# Patient Record
Sex: Female | Born: 1975 | Race: White | Hispanic: No | Marital: Married | Smoking: Never smoker
Health system: Southern US, Community
[De-identification: ages and names within clinical notes are randomized; demographics above are authoritative.]

## PROBLEM LIST (undated history)

## (undated) DIAGNOSIS — F419 Anxiety disorder, unspecified: Secondary | ICD-10-CM

## (undated) DIAGNOSIS — F988 Other specified behavioral and emotional disorders with onset usually occurring in childhood and adolescence: Secondary | ICD-10-CM

## (undated) DIAGNOSIS — I10 Essential (primary) hypertension: Secondary | ICD-10-CM

## (undated) HISTORY — PX: ABDOMINAL SURGERY: SHX537

## (undated) HISTORY — PX: TONSILLECTOMY: SUR1361

---

## 2013-04-14 ENCOUNTER — Ambulatory Visit: Payer: Self-pay | Admitting: Physician Assistant

## 2013-04-16 ENCOUNTER — Ambulatory Visit: Payer: Self-pay | Admitting: Physician Assistant

## 2013-04-19 ENCOUNTER — Encounter: Payer: Self-pay | Admitting: Physician Assistant

## 2013-04-19 ENCOUNTER — Ambulatory Visit (INDEPENDENT_AMBULATORY_CARE_PROVIDER_SITE_OTHER): Payer: BC Managed Care – PPO | Admitting: Physician Assistant

## 2013-04-19 VITALS — BP 163/101 | HR 116 | Ht 60.0 in | Wt 140.0 lb

## 2013-04-19 DIAGNOSIS — F909 Attention-deficit hyperactivity disorder, unspecified type: Secondary | ICD-10-CM | POA: Insufficient documentation

## 2013-04-19 DIAGNOSIS — M549 Dorsalgia, unspecified: Secondary | ICD-10-CM

## 2013-04-19 DIAGNOSIS — I10 Essential (primary) hypertension: Secondary | ICD-10-CM

## 2013-04-19 DIAGNOSIS — R790 Abnormal level of blood mineral: Secondary | ICD-10-CM

## 2013-04-19 DIAGNOSIS — R5381 Other malaise: Secondary | ICD-10-CM

## 2013-04-19 DIAGNOSIS — R7989 Other specified abnormal findings of blood chemistry: Secondary | ICD-10-CM

## 2013-04-19 MED ORDER — HYDROCODONE-ACETAMINOPHEN 5-325 MG PO TABS: 1.0000 | ORAL_TABLET | Freq: Every day | ORAL | Status: DC

## 2013-04-19 MED ORDER — AMPHETAMINE-DEXTROAMPHETAMINE 30 MG PO TABS: 30.0000 mg | ORAL_TABLET | Freq: Two times a day (BID) | ORAL | Status: DC

## 2013-04-19 MED ORDER — LISINOPRIL-HYDROCHLOROTHIAZIDE 20-12.5 MG PO TABS: 1.0000 | ORAL_TABLET | Freq: Every day | ORAL | Status: DC

## 2013-04-19 MED ORDER — TRAMADOL HCL 50 MG PO TABS: 50.0000 mg | ORAL_TABLET | ORAL | Status: DC | PRN

## 2013-04-19 NOTE — Patient Instructions (Signed)
Magnesium 500mg  ibuprofen help inflammation.

## 2013-04-20 LAB — FERRITIN: Ferritin: 2 ng/mL — ABNORMAL LOW (ref 10–291)

## 2013-04-20 LAB — CBC WITH DIFFERENTIAL/PLATELET
Eosinophils Absolute: 0.1 10*3/uL (ref 0.0–0.7)
Lymphocytes Relative: 33 % (ref 12–46)
Lymphs Abs: 3.3 10*3/uL (ref 0.7–4.0)
MCH: 27.1 pg (ref 26.0–34.0)
Monocytes Absolute: 0.4 10*3/uL (ref 0.1–1.0)
Neutrophils Relative %: 62 % (ref 43–77)
Platelets: 513 10*3/uL — ABNORMAL HIGH (ref 150–400)
RBC: 4.68 MIL/uL (ref 3.87–5.11)
WBC: 10.2 10*3/uL (ref 4.0–10.5)

## 2013-04-20 LAB — COMPLETE METABOLIC PANEL WITH GFR
AST: 16 U/L (ref 0–37)
Alkaline Phosphatase: 70 U/L (ref 39–117)
BUN: 9 mg/dL (ref 6–23)
Creat: 0.63 mg/dL (ref 0.50–1.10)
Potassium: 4.1 mEq/L (ref 3.5–5.3)
Total Bilirubin: 0.2 mg/dL — ABNORMAL LOW (ref 0.3–1.2)

## 2013-04-20 LAB — MAGNESIUM: Magnesium: 2.1 mg/dL (ref 1.5–2.5)

## 2013-04-20 LAB — VITAMIN B12: Vitamin B-12: 343 pg/mL (ref 211–911)

## 2013-04-20 LAB — VITAMIN D 25 HYDROXY (VIT D DEFICIENCY, FRACTURES): Vit D, 25-Hydroxy: 32 ng/mL (ref 30–89)

## 2013-04-21 DIAGNOSIS — R790 Abnormal level of blood mineral: Secondary | ICD-10-CM | POA: Insufficient documentation

## 2013-04-21 DIAGNOSIS — I10 Essential (primary) hypertension: Secondary | ICD-10-CM | POA: Insufficient documentation
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
## 2013-04-30 ENCOUNTER — Ambulatory Visit: Payer: BC Managed Care – PPO | Admitting: Physician Assistant

## 2013-05-17 ENCOUNTER — Ambulatory Visit: Payer: BC Managed Care – PPO | Admitting: Family Medicine

## 2013-05-17 ENCOUNTER — Other Ambulatory Visit: Payer: Self-pay | Admitting: *Deleted

## 2013-05-17 MED ORDER — HYDROCODONE-ACETAMINOPHEN 5-325 MG PO TABS: 1.0000 | ORAL_TABLET | Freq: Every day | ORAL | Status: DC

## 2013-05-18 ENCOUNTER — Ambulatory Visit (INDEPENDENT_AMBULATORY_CARE_PROVIDER_SITE_OTHER): Payer: BC Managed Care – PPO | Admitting: Physician Assistant

## 2013-05-18 ENCOUNTER — Encounter: Payer: Self-pay | Admitting: Physician Assistant

## 2013-05-18 VITALS — BP 140/96 | HR 113 | Wt 140.0 lb

## 2013-05-18 DIAGNOSIS — I10 Essential (primary) hypertension: Secondary | ICD-10-CM

## 2013-05-18 DIAGNOSIS — F909 Attention-deficit hyperactivity disorder, unspecified type: Secondary | ICD-10-CM

## 2013-05-18 NOTE — Progress Notes (Signed)
   Subjective:    Patient ID: Alicia Newton, female    DOB: 08-28-1975, 37 y.o.   MRN: [id number]  HPI Patient is a 38 year old female who presents to the clinic to followup on ADHD and hypertension.  ADHD-patient reports to be doing well on Adderall. She said she has been on it for many years. She denies any anorexia, palpitations. She is under a lot of stress at work. 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 She has also had one episode of vaginal bleeding where she went to the ER. CT and ultrasound were done. Diagnosis was gastroenteritis in combination with menstrual cycle. She has not had symptoms.  Hypertension-patient has not been taking his lisinopril/HCTZ prescribed at last visit. She did restart taking the labetalol but reports it makes her feel like "crap". She admits to not even taking the labetalol everyday as prescribed. She denies any chest pain, jaw pain, arm numbness or tingling.   Review of Systems     Objective:   Physical Exam  Constitutional: She is oriented to person, place, and time. She appears well-developed and well-nourished.  HENT:  Head: Normocephalic and atraumatic.  Cardiovascular: Regular rhythm and normal heart sounds.   Tachycardia at 113  Pulmonary/Chest: Effort normal and breath sounds normal.  Neurological: She is alert and oriented to person, place, and time.  Skin: Skin is warm and dry.  Psychiatric: She has a normal mood and affect. Her behavior is normal.          Assessment & Plan:  ADHD- discuss with patient that I could not give her Adderall today do to her blood pressure and pulse rate. She became very upset and stated that she had to have adderall. She stated that it was the only thing to keep her together. She had not been without it in 10 plus years. She requested to speak to manager. She spoke with Haskell Riling and confirmed that we could not give at office until BP and pulse rate was normalized. Pt  instructed to take BP medication and follow up on Monday or Tuesday of next week. If BP/HR controlled would refill adderall.   HTN- uncontrolled. Since labetalol makes her feel fatigued. At this time. Start lisinopril/HCTZ daily. Will check in one week and see if blood pressure and heart rate are more controlled. Concern is that she will need a beta blocker to better control heart rate.  Spent 30 minutes with patient greater than 50% of visit spent counseling patient regarding the importance of blood pressure control in combination with stimulants.

## 2013-05-21 ENCOUNTER — Ambulatory Visit (INDEPENDENT_AMBULATORY_CARE_PROVIDER_SITE_OTHER): Payer: BC Managed Care – PPO | Admitting: Physician Assistant

## 2013-05-21 ENCOUNTER — Encounter: Payer: Self-pay | Admitting: *Deleted

## 2013-05-21 VITALS — BP 110/78 | HR 102 | Ht 60.0 in | Wt 137.0 lb

## 2013-05-21 DIAGNOSIS — F909 Attention-deficit hyperactivity disorder, unspecified type: Secondary | ICD-10-CM

## 2013-05-21 DIAGNOSIS — I10 Essential (primary) hypertension: Secondary | ICD-10-CM

## 2013-05-21 MED ORDER — AMPHETAMINE-DEXTROAMPHETAMINE 30 MG PO TABS: 30.0000 mg | ORAL_TABLET | Freq: Two times a day (BID) | ORAL | Status: DC

## 2013-05-21 NOTE — Progress Notes (Signed)
Pt here for bp recheck today, to get refill of adderall. She states that she has been taking her bp meds at night.Laureen Ochs, Viann Shove   Patient's blood pressure is controlled today. Will refill Adderall. Followup in one month. Continue on lisinopril/HCTZ. Tandy Gaw PA-C

## 2013-05-26 ENCOUNTER — Ambulatory Visit (INDEPENDENT_AMBULATORY_CARE_PROVIDER_SITE_OTHER): Payer: BC Managed Care – PPO | Admitting: Physician Assistant

## 2013-05-26 ENCOUNTER — Ambulatory Visit (INDEPENDENT_AMBULATORY_CARE_PROVIDER_SITE_OTHER): Payer: BC Managed Care – PPO

## 2013-05-26 ENCOUNTER — Encounter: Payer: Self-pay | Admitting: Physician Assistant

## 2013-05-26 VITALS — BP 131/79 | HR 117 | Temp 97.7°F | Wt 136.0 lb

## 2013-05-26 DIAGNOSIS — M545 Low back pain: Secondary | ICD-10-CM

## 2013-05-26 DIAGNOSIS — R4589 Other symptoms and signs involving emotional state: Secondary | ICD-10-CM

## 2013-05-26 DIAGNOSIS — R1032 Left lower quadrant pain: Secondary | ICD-10-CM

## 2013-05-26 DIAGNOSIS — F411 Generalized anxiety disorder: Secondary | ICD-10-CM

## 2013-05-26 MED ORDER — HYDROCODONE-ACETAMINOPHEN 5-325 MG PO TABS: 1.0000 | ORAL_TABLET | Freq: Every day | ORAL | Status: DC

## 2013-05-26 MED ORDER — TRAMADOL HCL 50 MG PO TABS: 50.0000 mg | ORAL_TABLET | ORAL | Status: DC | PRN

## 2013-05-26 MED ORDER — BUPROPION HCL ER (XL) 150 MG PO TB24: 150.0000 mg | ORAL_TABLET | Freq: Every day | ORAL | Status: DC
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
## 2013-05-26 NOTE — Patient Instructions (Addendum)
Start wellbutrin 150mg  daily. Use tramadol as needed for break through pain. Will refer to OB/GYN. Magnesium 500mg  daily. B12 1000units, D3 1000units and ferrous sulfate 325mg  once to twice a day.   miralax 1 capful every morning.   Irritable Bowel Syndrome Irritable Bowel Syndrome (IBS) is caused by a disturbance of normal bowel function. Other terms used are spastic colon, mucous colitis, and irritable colon. It does not require surgery, nor does it lead to cancer. There is no cure for IBS. But with proper diet, stress reduction, and medication, you will find that your problems (symptoms) will gradually disappear or improve. IBS is a common digestive disorder. It usually appears in late adolescence or early adulthood. Women develop it twice as often as men. CAUSES  After food has been digested and absorbed in the small intestine, waste material is moved into the colon (large intestine). In the colon, water and salts are absorbed from the undigested products coming from the small intestine. The remaining residue, or fecal material, is held for elimination. Under normal circumstances, gentle, rhythmic contractions on the bowel walls push the fecal material along the colon towards the rectum. In IBS, however, these contractions are irregular and poorly coordinated. The fecal material is either retained too long, resulting in constipation, or expelled too soon, producing diarrhea. SYMPTOMS  The most common symptom of IBS is pain. It is typically in the lower left side of the belly (abdomen). But it may occur anywhere in the abdomen. It can be felt as heartburn, backache, or even as a dull pain in the arms or shoulders. The pain comes from excessive bowel-muscle spasms and from the buildup of gas and fecal material in the colon. This pain:  Can range from sharp belly (abdominal) cramps to a dull, continuous ache.  Usually worsens soon after eating.  Is typically relieved by having a bowel movement or  passing gas. Abdominal pain is usually accompanied by constipation. But it may also produce diarrhea. The diarrhea typically occurs right after a meal or upon arising in the morning. The stools are typically soft and watery. They are often flecked with secretions (mucus). Other symptoms of IBS include:  Bloating.  Loss of appetite.  Heartburn.  Feeling sick to your stomach (nausea).  Belching  Vomiting  Gas. IBS may also cause a number of symptoms that are unrelated to the digestive system:  Fatigue.  Headaches.  Anxiety  Shortness of breath  Difficulty in concentrating.  Dizziness. These symptoms tend to come and go. DIAGNOSIS  The symptoms of IBS closely mimic the symptoms of other, more serious digestive disorders. So your caregiver may wish to perform a variety of additional tests to exclude these disorders. He/she wants to be certain of learning what is wrong (diagnosis). The nature and purpose of each test will be explained to you. TREATMENT A number of medications are available to help correct bowel function and/or relieve bowel spasms and abdominal pain. Among the drugs available are:  Mild, non-irritating laxatives for severe constipation and to help restore normal bowel habits.  Specific anti-diarrheal medications to treat severe or prolonged diarrhea.  Anti-spasmodic agents to relieve intestinal cramps.  Your caregiver may also decide to treat you with a mild tranquilizer or sedative during unusually stressful periods in your life. The important thing to remember is that if any drug is prescribed for you, make sure that you take it exactly as directed. Make sure that your caregiver knows how well it worked for you. HOME CARE INSTRUCTIONS  Avoid foods that are high in fat or oils. Some examples ZOX:WRUEA cream, butter, frankfurters, sausage, and other fatty meats.  Avoid foods that have a laxative effect, such as fruit, fruit juice, and dairy  products.  Cut out carbonated drinks, chewing gum, and "gassy" foods, such as beans and cabbage. This may help relieve bloating and belching.  Bran taken with plenty of liquids may help relieve constipation.  Keep track of what foods seem to trigger your symptoms.  Avoid emotionally charged situations or circumstances that produce anxiety.  Start or continue exercising.  Get plenty of rest and sleep. MAKE SURE YOU:   Understand these instructions.  Will watch your condition.  Will get help right away if you are not doing well or get worse. Document Released: 05/13/2005 Document Revised: 08/05/2011 Document Reviewed: 01/01/2008 [facility] Patient Information 2014 [place], [place].

## 2013-05-27 LAB — FOLLICLE STIMULATING HORMONE: FSH: 1.8 m[IU]/mL

## 2013-05-27 LAB — TSH: TSH: 0.687 u[IU]/mL (ref 0.350–4.500)

## 2013-05-27 LAB — T4, FREE: Free T4: 1.25 ng/dL (ref 0.80–1.80)

## 2013-05-27 LAB — LUTEINIZING HORMONE: LH: 1.2 m[IU]/mL

## 2013-05-31 ENCOUNTER — Telehealth: Payer: Self-pay | Admitting: *Deleted

## 2013-05-31 NOTE — Telephone Encounter (Signed)
Pharm called needing to speak to you directly in regards to pt's pain meds.  She had another rx for norco filled on 12/22 at a [facility] & they can not fill the tramadol with the way it is written.  It has to have a specific hourly amount or max per day amount on there-they won't take one a day as needed. Pharm # is [phone] (you have to dial the area code)

## 2013-06-02 ENCOUNTER — Other Ambulatory Visit: Payer: Self-pay | Admitting: Physician Assistant

## 2013-06-02 ENCOUNTER — Encounter: Payer: Self-pay | Admitting: *Deleted

## 2013-06-02 MED ORDER — TRAMADOL HCL 50 MG PO TABS: 50.0000 mg | ORAL_TABLET | Freq: Two times a day (BID) | ORAL | Status: DC | PRN

## 2013-06-02 NOTE — Telephone Encounter (Signed)
Amber, called pharmacy and she took both prescriptions back. I re wrote tramadol for her to pick up. Because she is filling another rx on the 22nd and 31st it alerted pharmacy. She should use tramadol until she can get hydrocodone refilled. She should only be taking 1 hydrocodone at night and tramadol only if needed during the day.

## 2013-06-03 NOTE — Telephone Encounter (Signed)
LMOM for pt to return my call.

## 2013-06-04 NOTE — Telephone Encounter (Signed)
Pt notified.

## 2013-06-06 LAB — ESTRADIOL, FREE
Estradiol, Free: 0.86 pg/mL
Estradiol: 72 pg/mL

## 2013-06-14 ENCOUNTER — Emergency Department
Admission: EM | Admit: 2013-06-14 | Discharge: 2013-06-14 | Discharge status: Absent without leave | Payer: BC Managed Care – PPO | Source: Home / Self Care

## 2013-06-14 ENCOUNTER — Emergency Department (INDEPENDENT_AMBULATORY_CARE_PROVIDER_SITE_OTHER)
Admission: EM | Admit: 2013-06-14 | Discharge: 2013-06-14 | Disposition: A | Discharge status: Home / Self Care | Payer: BC Managed Care – PPO | Source: Home / Self Care | Attending: Family Medicine | Admitting: Family Medicine

## 2013-06-14 ENCOUNTER — Encounter: Payer: Self-pay | Admitting: Emergency Medicine

## 2013-06-14 DIAGNOSIS — K089 Disorder of teeth and supporting structures, unspecified: Secondary | ICD-10-CM

## 2013-06-14 DIAGNOSIS — K029 Dental caries, unspecified: Secondary | ICD-10-CM

## 2013-06-14 DIAGNOSIS — K0889 Other specified disorders of teeth and supporting structures: Secondary | ICD-10-CM

## 2013-06-14 HISTORY — DX: Essential (primary) hypertension: I10

## 2013-06-14 HISTORY — DX: Other specified behavioral and emotional disorders with onset usually occurring in childhood and adolescence: F98.8

## 2013-06-14 HISTORY — DX: Anxiety disorder, unspecified: F41.9

## 2013-06-14 MED ORDER — MELOXICAM 7.5 MG PO TABS: 7.5000 mg | ORAL_TABLET | Freq: Every day | ORAL | Status: DC

## 2013-06-14 MED ORDER — AMOXICILLIN 875 MG PO TABS: 875.0000 mg | ORAL_TABLET | Freq: Two times a day (BID) | ORAL | Status: DC

## 2013-06-14 MED ORDER — HYDROCODONE-ACETAMINOPHEN 7.5-300 MG PO TABS: 1.0000 | ORAL_TABLET | Freq: Four times a day (QID) | ORAL | Status: DC | PRN

## 2013-06-14 NOTE — ED Provider Notes (Signed)
CSN: [id number]     Arrival date & time 06/14/13  1723 History   First MD Initiated Contact with Patient 06/14/13 1847     Chief Complaint  Patient presents with  . Dental Pain      HPI Comments: Patient complains of one week history of pain in a right mandibular wisdom tooth that has worsened over the past 3 days.  She feels that there is swelling in her right jaw.  No fevers, chills, and sweats.  She has an appt with her dentist later this month.  Patient is a 38 y.o. female presenting with tooth pain. The history is provided by the patient.  Dental Pain Location:  Lower Lower teeth location:  31/RL 2nd molar and 32/RL 3rd molar Quality:  Aching Severity:  Moderate Onset quality:  Gradual Duration:  3 days Timing:  Constant Progression:  Worsening Chronicity:  New Context: dental caries, dental fracture and poor dentition   Worsened by:  Nothing tried Ineffective treatments: tramadol. Associated symptoms: gum swelling and headaches   Associated symptoms: no congestion, no difficulty swallowing, no drooling, no facial pain, no facial swelling, no fever, no neck pain, no neck swelling, no oral bleeding and no oral lesions   Risk factors: lack of dental care     Past Medical History  Diagnosis Date  . Hypertension   . Anxiety   . ADD (attention deficit disorder)    Past Surgical History  Procedure Laterality Date  . Tonsillectomy    . Abdominal surgery     Family History  Problem Relation Age of Onset  . Diabetes Mother   . Hypertension Mother    History  Substance Use Topics  . Smoking status: Never Smoker   . Smokeless tobacco: Not on file  . Alcohol Use: No   OB History   Grav Para Term Preterm Abortions TAB SAB Ect Mult Living                 Review of Systems  Constitutional: Negative for fever.  HENT: Negative for congestion, drooling, facial swelling and mouth sores.   Musculoskeletal: Negative for neck pain.  Neurological: Positive for headaches.    All other systems reviewed and are negative.    Allergies  Review of patient's allergies indicates no known allergies.  Home Medications   Current Outpatient Rx  Name  Route  Sig  Dispense  Refill  . amoxicillin (AMOXIL) 875 MG tablet   Oral   Take 1 tablet (875 mg total) by mouth 2 (two) times daily.   20 tablet   0   . amphetamine-dextroamphetamine (ADDERALL) 30 MG tablet   Oral   Take 1 tablet (30 mg total) by mouth 2 (two) times daily.   60 tablet   0   . buPROPion (WELLBUTRIN XL) 150 MG 24 hr tablet   Oral   Take 1 tablet (150 mg total) by mouth daily.   30 tablet   1   . HYDROcodone-acetaminophen (NORCO/VICODIN) 5-325 MG per tablet   Oral   Take 1 tablet by mouth at bedtime.   30 tablet   0   . Hydrocodone-Acetaminophen 7.5-300 MG TABS   Oral   Take 1 tablet by mouth every 6 (six) hours as needed.   10 each   0   . lisinopril-hydrochlorothiazide (PRINZIDE,ZESTORETIC) 20-12.5 MG per tablet   Oral   Take 1 tablet by mouth daily.   30 tablet   1   . meloxicam (MOBIC) 7.5 MG tablet  Oral   Take 1 tablet (7.5 mg total) by mouth daily. Take with supper   10 tablet   0   . traMADol (ULTRAM) 50 MG tablet   Oral   Take 1 tablet (50 mg total) by mouth every 12 (twelve) hours as needed for moderate pain.   30 tablet   1    BP 108/77  Pulse 105  Temp(Src) 98.5 F (36.9 C) (Oral)  Resp 16  Ht 5\' 1"  (1.549 m)  Wt 138 lb (62.596 kg)  BMI 26.09 kg/m2  SpO2 99%  LMP 06/02/2013 Physical Exam  Nursing note and vitals reviewed. Constitutional: She appears well-developed and well-nourished. No distress.  HENT:  Head: Normocephalic.  Right Ear: External ear normal.  Left Ear: External ear normal.  Nose: Nose normal.  Mouth/Throat: Mucous membranes are normal. No oral lesions. No trismus in the jaw. Abnormal dentition. Dental caries present. No uvula swelling. No oropharyngeal exudate.    Noted teeth in right mandible are decayed and eroded, with  tender to tap.  Adjacent gingiva is tender but not swollen. No facial swelling.  Eyes: Conjunctivae are normal. Pupils are equal, round, and reactive to light.  Neck: Neck supple.  Cardiovascular: Normal heart sounds.   Pulmonary/Chest: Breath sounds normal.  Lymphadenopathy:    She has no cervical adenopathy.    ED Course  Procedures  none       MDM   1. Toothache   2. Dental caries    Begin amoxicillin.  Mobic 7.5mg  once daily with food. Increase Vicodin to 7.5/300 Q6hr for 2 days until pain decreases.   Followup with dentist as soon as possible.     Lattie Haw, MD 06/17/13 1131

## 2013-06-14 NOTE — Discharge Instructions (Signed)
Try warm salt water gargles.   Dental Pain A tooth ache may be caused by cavities (tooth decay). Cavities expose the nerve of the tooth to air and hot or cold temperatures. It may come from an infection or abscess (also called a boil or furuncle) around your tooth. It is also often caused by dental caries (tooth decay). This causes the pain you are having. DIAGNOSIS  Your caregiver can diagnose this problem by exam. TREATMENT   If caused by an infection, it may be treated with medications which kill germs (antibiotics) and pain medications as prescribed by your caregiver. Take medications as directed.  Only take over-the-counter or prescription medicines for pain, discomfort, or fever as directed by your caregiver.  Whether the tooth ache today is caused by infection or dental disease, you should see your dentist as soon as possible for further care. SEEK MEDICAL CARE IF: The exam and treatment you received today has been provided on an emergency basis only. This is not a substitute for complete medical or dental care. If your problem worsens or new problems (symptoms) appear, and you are unable to meet with your dentist, call or return to this location. SEEK IMMEDIATE MEDICAL CARE IF:   You have a fever.  You develop redness and swelling of your face, jaw, or neck.  You are unable to open your mouth.  You have severe pain uncontrolled by pain medicine. MAKE SURE YOU:   Understand these instructions.  Will watch your condition.  Will get help right away if you are not doing well or get worse. Document Released: 05/13/2005 Document Revised: 08/05/2011 Document Reviewed: 12/30/2007 [facility] Patient Information 2014 [place], [place].

## 2013-06-14 NOTE — ED Notes (Signed)
Reports 3 day history of right lower jaw/tooth pain; may have broken tooth; has appointment with Dentist later this month.

## 2013-06-16 ENCOUNTER — Encounter: Payer: Self-pay | Admitting: Physician Assistant

## 2013-06-16 ENCOUNTER — Ambulatory Visit (INDEPENDENT_AMBULATORY_CARE_PROVIDER_SITE_OTHER): Payer: BC Managed Care – PPO | Admitting: Physician Assistant

## 2013-06-16 VITALS — BP 115/75 | HR 110 | Wt 134.0 lb

## 2013-06-16 DIAGNOSIS — J069 Acute upper respiratory infection, unspecified: Secondary | ICD-10-CM

## 2013-06-16 DIAGNOSIS — N926 Irregular menstruation, unspecified: Secondary | ICD-10-CM

## 2013-06-16 DIAGNOSIS — N939 Abnormal uterine and vaginal bleeding, unspecified: Secondary | ICD-10-CM

## 2013-06-16 DIAGNOSIS — F909 Attention-deficit hyperactivity disorder, unspecified type: Secondary | ICD-10-CM

## 2013-06-16 MED ORDER — NORETHIN-ETH ESTRAD BIPHASIC 0.5-35/1-35 MG-MCG PO TABS: 1.0000 | ORAL_TABLET | Freq: Every day | ORAL | Status: DC

## 2013-06-16 MED ORDER — HYDROCODONE-ACETAMINOPHEN 5-325 MG PO TABS: 1.0000 | ORAL_TABLET | Freq: Every day | ORAL | Status: DC

## 2013-06-16 MED ORDER — AMPHETAMINE-DEXTROAMPHETAMINE 30 MG PO TABS: 30.0000 mg | ORAL_TABLET | Freq: Two times a day (BID) | ORAL | Status: DC

## 2013-06-16 MED ORDER — LISINOPRIL-HYDROCHLOROTHIAZIDE 20-12.5 MG PO TABS: 1.0000 | ORAL_TABLET | Freq: Every day | ORAL | Status: DC

## 2013-06-16 MED ORDER — CYCLOBENZAPRINE HCL 10 MG PO TABS: 10.0000 mg | ORAL_TABLET | Freq: Three times a day (TID) | ORAL | Status: DC | PRN

## 2013-06-16 NOTE — Patient Instructions (Signed)
Start birth control tablets daily.   Upper Respiratory Infection, Adult An upper respiratory infection (URI) is also known as the common cold. It is often caused by a type of germ (virus). Colds are easily spread (contagious). You can pass it to others by kissing, coughing, sneezing, or drinking out of the same glass. Usually, you get better in 1 or 2 weeks.  HOME CARE   Only take medicine as told by your doctor.  Use a warm mist humidifier or breathe in steam from a hot shower.  Drink enough water and fluids to keep your pee (urine) clear or pale yellow.  Get plenty of rest.  Return to work when your temperature is back to normal or as told by your doctor. You may use a face mask and wash your hands to stop your cold from spreading. GET HELP RIGHT AWAY IF:   After the first few days, you feel you are getting worse.  You have questions about your medicine.  You have chills, shortness of breath, or brown or red spit (mucus).  You have yellow or brown snot (nasal discharge) or pain in the face, especially when you bend forward.  You have a fever, puffy (swollen) neck, pain when you swallow, or white spots in the back of your throat.  You have a bad headache, ear pain, sinus pain, or chest pain.  You have a high-pitched whistling sound when you breathe in and out (wheezing).  You have a lasting cough or cough up blood.  You have sore muscles or a stiff neck. MAKE SURE YOU:   Understand these instructions.  Will watch your condition.  Will get help right away if you are not doing well or get worse. Document Released: 10/30/2007 Document Revised: 08/05/2011 Document Reviewed: 09/17/2010 [facility] Patient Information 2014 [place], [place].

## 2013-06-17 NOTE — Progress Notes (Signed)
   Subjective:    Patient ID: Alicia Newton, female    DOB: 01-14-1976, 38 y.o.   MRN: [id number]  HPI Pt reports to the clinic with nasal congestion, sinus pressure and feeling weak. Seen in urgent care on 1/19 for dental pain and abscess. Given amoxil for 10 days. Denies any fever, chills, nausea, SOB, wheezing. Not trying anything else OTC.   ADHD- needs refill. No problems to report. Controlled on current dose. Denies any insomnia, anorexia, palpitations.   Irregular periods- pt is having full periods every 2-3 weeks and then sometimes longer. Pt wants some regulation. Does not smoke and no hx of blood clots. Did get pregnant last time went on birth control but think it was from abx while on pills. No cramping or abdominal pain.    Review of Systems     Objective:   Physical Exam  Constitutional: She is oriented to person, place, and time. She appears well-developed and well-nourished.  HENT:  Head: Normocephalic and atraumatic.  Right Ear: External ear normal.  Left Ear: External ear normal.  Nose: Nose normal.  Mouth/Throat: Oropharynx is clear and moist.  Eyes: Conjunctivae are normal. Right eye exhibits no discharge. Left eye exhibits no discharge.  Neck: Normal range of motion. Neck supple.  Cardiovascular: Regular rhythm and normal heart sounds.   tacycardia at 110 after recheck.   Pulmonary/Chest: Effort normal and breath sounds normal. She has no wheezes.  Lymphadenopathy:    She has no cervical adenopathy.  Neurological: She is alert and oriented to person, place, and time.  Skin: Skin is dry.  Psychiatric: She has a normal mood and affect. Her behavior is normal.          Assessment & Plan:  URI- pt already on abx from urgent care. Discussed sounded viral.maybe why not getting much relief from abx. Continue symptomatic treatment. Call if symptoms worsen.  ADHD- refilled for 1 month. Follow up in 2 months. Will continue to monitor pulse.   Irregular menstrual  cycle- Discussed OCP. Pt agreed. Gave pt pills to be a Sunday start after next menstrual cycle. Pt aware of risk of Blood clots and nausea. Discussed what to do with missed doses. Follow up in 2 months. Pap up to date last year. Condoms during first 7 days and abx usage.   Back pain- request hydrocodone. Per pt threw away last rx by accident. Looked in database and did not see that it was filled. Discussed proper usage of hydrocodone as directed once at bedtime. Refilled today. Make appt to discuss back pain in next couple of months to see if other options for pain.

## 2013-06-18 ENCOUNTER — Ambulatory Visit: Payer: BC Managed Care – PPO | Admitting: Physician Assistant

## 2013-06-23 ENCOUNTER — Telehealth: Payer: Self-pay | Admitting: *Deleted

## 2013-06-23 ENCOUNTER — Ambulatory Visit (INDEPENDENT_AMBULATORY_CARE_PROVIDER_SITE_OTHER): Payer: BC Managed Care – PPO | Admitting: Physician Assistant

## 2013-06-23 ENCOUNTER — Encounter: Payer: Self-pay | Admitting: Physician Assistant

## 2013-06-23 VITALS — BP 139/91 | HR 113 | Wt 133.0 lb

## 2013-06-23 DIAGNOSIS — F32A Depression, unspecified: Secondary | ICD-10-CM

## 2013-06-23 DIAGNOSIS — F3289 Other specified depressive episodes: Secondary | ICD-10-CM

## 2013-06-23 DIAGNOSIS — F430 Acute stress reaction: Secondary | ICD-10-CM

## 2013-06-23 DIAGNOSIS — F411 Generalized anxiety disorder: Secondary | ICD-10-CM

## 2013-06-23 DIAGNOSIS — F329 Major depressive disorder, single episode, unspecified: Secondary | ICD-10-CM

## 2013-06-23 MED ORDER — HYDROXYZINE HCL 25 MG PO TABS: 25.0000 mg | ORAL_TABLET | Freq: Three times a day (TID) | ORAL | Status: DC | PRN

## 2013-06-23 NOTE — Telephone Encounter (Signed)
Pt called back wanting to make sure you knew that her being out of work started yesterday, and she wants to know if you will write for 14 days.

## 2013-06-23 NOTE — Patient Instructions (Signed)
Start vistaril as needed up to three times a day for anxiety. Start Wellbutrin daily.   Wrote out for 1 week.

## 2013-06-23 NOTE — Progress Notes (Signed)
   Subjective:    Patient ID: Alicia Newton, female    DOB: 07-25-1975, 38 y.o.   MRN: [id number]  HPI Pt presents to the clinic to discuss her stress and anxiety. Pt is under a lot of stress right now. Her son has moved back in with her from his fathers. He is having problems in school and ex husband now wants 36 yo son back. He was not doing well with father sleeping on air mattress, not going to school, father drunk all the time. Father canceled insurance on son yesterday. Father is trying to convince him to run away. She is trying to get emergency custody order. She has not gone to work in 2 days. She is completely overwhelmed. Denies any suicidal or homicidal thoughts. She does not have a lot of help and mother is having surgery this week.    Review of Systems     Objective:   Physical Exam  Constitutional: She is oriented to person, place, and time. She appears well-developed and well-nourished.  HENT:  Head: Normocephalic and atraumatic.  Neck: Normal range of motion. Neck supple.  Cardiovascular: Regular rhythm and normal heart sounds.   Tachycardia at 113.   Pulmonary/Chest: Effort normal and breath sounds normal.  Neurological: She is alert and oriented to person, place, and time.  Skin: Skin is dry.  Psychiatric:  Pt is very hyperverbal. She seems very anxious.           Assessment & Plan:  Acute stress reaction/anxiety/depression- GAD-7 was 20. PHQ-9 was 14. I do not feel comfortable giving another controlled substance to pt. Will try vistaril up to three times a day for anxiety. Pt instructed to start wellbutrin she has not started at this time. I do think pt needs to get personal affairs in order and very overwhelmed today. Wrote out of work for 14 days. Practice deep breathing, positive thinking and relaxation techniques. Follow up in 2 weeks.   Spent 30 minutes with patient and greater than 50 percent of visit spent counseling pt regarding anxiety and stress.

## 2013-06-23 NOTE — Telephone Encounter (Signed)
Ok to write out of work from 06/22/13-07/06/13. For acute stress reaction.

## 2013-06-23 NOTE — Telephone Encounter (Signed)
Pt notified & letter printed.

## 2013-06-30 ENCOUNTER — Other Ambulatory Visit: Payer: Self-pay | Admitting: *Deleted

## 2013-06-30 MED ORDER — TRAMADOL HCL 50 MG PO TABS: 50.0000 mg | ORAL_TABLET | Freq: Two times a day (BID) | ORAL | Status: DC | PRN

## 2013-07-07 ENCOUNTER — Ambulatory Visit: Payer: BC Managed Care – PPO | Admitting: Physician Assistant

## 2013-07-09 ENCOUNTER — Ambulatory Visit (INDEPENDENT_AMBULATORY_CARE_PROVIDER_SITE_OTHER): Payer: BC Managed Care – PPO | Admitting: Physician Assistant

## 2013-07-09 ENCOUNTER — Encounter: Payer: Self-pay | Admitting: Physician Assistant

## 2013-07-09 VITALS — BP 114/76 | HR 108 | Wt 132.0 lb

## 2013-07-09 DIAGNOSIS — M549 Dorsalgia, unspecified: Secondary | ICD-10-CM

## 2013-07-09 DIAGNOSIS — F411 Generalized anxiety disorder: Secondary | ICD-10-CM | POA: Insufficient documentation

## 2013-07-09 DIAGNOSIS — F909 Attention-deficit hyperactivity disorder, unspecified type: Secondary | ICD-10-CM

## 2013-07-09 DIAGNOSIS — R000 Tachycardia, unspecified: Secondary | ICD-10-CM

## 2013-07-09 DIAGNOSIS — F430 Acute stress reaction: Secondary | ICD-10-CM

## 2013-07-09 DIAGNOSIS — I498 Other specified cardiac arrhythmias: Secondary | ICD-10-CM

## 2013-07-09 MED ORDER — HYDROCODONE-ACETAMINOPHEN 5-325 MG PO TABS: 1.0000 | ORAL_TABLET | Freq: Every day | ORAL | Status: DC

## 2013-07-09 MED ORDER — AMPHETAMINE-DEXTROAMPHETAMINE 30 MG PO TABS: 30.0000 mg | ORAL_TABLET | Freq: Two times a day (BID) | ORAL | Status: DC

## 2013-07-09 MED ORDER — TRAMADOL HCL 50 MG PO TABS: 50.0000 mg | ORAL_TABLET | Freq: Two times a day (BID) | ORAL | Status: DC | PRN

## 2013-07-09 NOTE — Progress Notes (Signed)
   Subjective:    Patient ID: Alicia Newton, female    DOB: [birth year], 38 y.o.   MRN: [id number]  HPI Pt is a 38 yo female who presents to the clinic to follow up on being written out of work for 2 weeks. Pt is much less anxious today. She is getting things worked out with her son and now has full custody. She is going to start looking for another job now. She realizes that she is working too much and causing more stress. She has not started wellbutrin. She wants too but has not because she is afraid of how she will respond to medication.   ADHD- needs refilled symptoms controlled currently on medication. No CP, palpitations, SOB.   Back pain- discussed that pt only needs to take medication as directed and get from same pharmacy. Pt is having some wisdom tooth pain and using more tramadol to control pain. She will have teeth removed in next 4-6 weeks over spring break. [place]. Ongoing back pain controlled with hydrocodone at bedtime and tramadol during the day.    Review of Systems     Objective:   Physical Exam  Constitutional: She is oriented to person, place, and time. She appears well-developed and well-nourished.  HENT:  Head: Normocephalic and atraumatic.  Cardiovascular: Regular rhythm and normal heart sounds.   Tachycardia rechecked at 108.   Pulmonary/Chest: Effort normal and breath sounds normal.  Neurological: She is alert and oriented to person, place, and time.  Skin: Skin is dry.  Psychiatric: She has a normal mood and affect. Her behavior is normal.          Assessment & Plan:  Acute Stress Reaction- PHQ-9 was 7. GAD-7 was 7. This is much improved since last visit of GAD-7 was 20. Pt never started wellbutrin encouraged pt to do this and follow up 4-6 weeks after. Per pt she is going to start. Encouraged pt that if she feels like work is what is stressing her than she consider a change. Part-time would likely  benefit pt since she has so many responsibility. Wrote pt back into work.  ADHD- refilled medication for 2 months.    Sinus tachycardia- pt's HR is elevated on and off stimulant. I would like to consider BB to slow HR down. Pt wants to wait for recheck stating she continues to feel very anxious and has not started wellbutrin.    Back pain/narcotic use discussed- pt signed a contract today for narcotics. Refilled medication with do not fill until 2/21 on hydrocodone and refilled tramadol for during the day usage. In one month will get UDS and make sure pt is taking as directed.

## 2013-07-11 DIAGNOSIS — R000 Tachycardia, unspecified: Secondary | ICD-10-CM | POA: Insufficient documentation

## 2013-07-30 ENCOUNTER — Telehealth: Payer: Self-pay | Admitting: *Deleted

## 2013-07-30 NOTE — Telephone Encounter (Signed)
Pt left message asking if the extended release adderall would work better for her.  She states that she takes her second pill around 10 & by 2 ish it's already worn off.

## 2013-08-02 NOTE — Telephone Encounter (Signed)
Tried calling pt but the vm has not been set up.  Will try again later.

## 2013-08-02 NOTE — Telephone Encounter (Signed)
Call pt: I need office to document change is controlled substance rx. We could certainly try extended release. You should not need refill until 3/21 correct?

## 2013-08-03 ENCOUNTER — Ambulatory Visit: Payer: BC Managed Care – PPO | Admitting: Physician Assistant

## 2013-08-03 NOTE — Telephone Encounter (Signed)
Pt has appt wed.

## 2013-08-04 ENCOUNTER — Ambulatory Visit: Payer: BC Managed Care – PPO | Admitting: Physician Assistant

## 2013-08-12 ENCOUNTER — Other Ambulatory Visit: Payer: Self-pay | Admitting: *Deleted

## 2013-08-12 MED ORDER — AMPHETAMINE-DEXTROAMPHETAMINE 30 MG PO TABS
30.0000 mg | ORAL_TABLET | Freq: Two times a day (BID) | ORAL | Status: DC
Start: 2013-08-12 — End: 2013-09-10

## 2013-08-12 MED ORDER — HYDROCODONE-ACETAMINOPHEN 5-325 MG PO TABS: 1.0000 | ORAL_TABLET | Freq: Every day | ORAL | Status: DC

## 2013-08-13 ENCOUNTER — Ambulatory Visit: Payer: BC Managed Care – PPO | Admitting: Physician Assistant

## 2013-09-10 ENCOUNTER — Telehealth: Payer: Self-pay | Admitting: *Deleted

## 2013-09-10 ENCOUNTER — Other Ambulatory Visit: Payer: Self-pay | Admitting: *Deleted

## 2013-09-10 MED ORDER — AMPHETAMINE-DEXTROAMPHETAMINE 30 MG PO TABS: 30.0000 mg | ORAL_TABLET | Freq: Two times a day (BID) | ORAL | Status: DC

## 2013-09-10 MED ORDER — HYDROCODONE-ACETAMINOPHEN 5-325 MG PO TABS: 1.0000 | ORAL_TABLET | Freq: Every day | ORAL | Status: DC

## 2013-09-10 MED ORDER — TRAMADOL HCL 50 MG PO TABS: 50.0000 mg | ORAL_TABLET | Freq: Two times a day (BID) | ORAL | Status: DC | PRN

## 2013-09-10 NOTE — Telephone Encounter (Signed)
Pt left vm asking for a refill on her adderall, vicodin, & tramadol. Are you ok with filling these? As well as her moms adderall & vicodin? Alicia DoomJudy Newton

## 2013-09-10 NOTE — Telephone Encounter (Signed)
rx printed

## 2013-09-10 NOTE — Telephone Encounter (Signed)
Yes ok due for follow up in may.

## 2013-10-08 ENCOUNTER — Other Ambulatory Visit: Payer: Self-pay | Admitting: *Deleted

## 2013-10-08 MED ORDER — HYDROCODONE-ACETAMINOPHEN 5-325 MG PO TABS: 1.0000 | ORAL_TABLET | Freq: Every day | ORAL | Status: DC

## 2013-10-08 MED ORDER — AMPHETAMINE-DEXTROAMPHETAMINE 30 MG PO TABS: 30.0000 mg | ORAL_TABLET | Freq: Two times a day (BID) | ORAL | Status: DC

## 2013-11-04 ENCOUNTER — Other Ambulatory Visit: Payer: Self-pay | Admitting: *Deleted

## 2013-11-04 MED ORDER — AMPHETAMINE-DEXTROAMPHETAMINE 30 MG PO TABS: 30.0000 mg | ORAL_TABLET | Freq: Two times a day (BID) | ORAL | Status: DC

## 2013-11-04 MED ORDER — HYDROCODONE-ACETAMINOPHEN 5-325 MG PO TABS: 1.0000 | ORAL_TABLET | Freq: Every day | ORAL | Status: DC

## 2013-12-01 ENCOUNTER — Ambulatory Visit (INDEPENDENT_AMBULATORY_CARE_PROVIDER_SITE_OTHER): Payer: BC Managed Care – PPO | Admitting: Physician Assistant

## 2013-12-01 ENCOUNTER — Encounter: Payer: Self-pay | Admitting: Physician Assistant

## 2013-12-01 VITALS — BP 148/90 | HR 101 | Ht 61.0 in | Wt 151.0 lb

## 2013-12-01 DIAGNOSIS — F411 Generalized anxiety disorder: Secondary | ICD-10-CM

## 2013-12-01 DIAGNOSIS — I10 Essential (primary) hypertension: Secondary | ICD-10-CM

## 2013-12-01 DIAGNOSIS — M545 Low back pain, unspecified: Secondary | ICD-10-CM

## 2013-12-01 DIAGNOSIS — F909 Attention-deficit hyperactivity disorder, unspecified type: Secondary | ICD-10-CM

## 2013-12-01 DIAGNOSIS — F900 Attention-deficit hyperactivity disorder, predominantly inattentive type: Secondary | ICD-10-CM

## 2013-12-01 MED ORDER — AMPHETAMINE-DEXTROAMPHET ER 10 MG PO CP24: 10.0000 mg | ORAL_CAPSULE | Freq: Every day | ORAL | Status: DC

## 2013-12-01 MED ORDER — AMPHETAMINE-DEXTROAMPHET ER 30 MG PO CP24: 30.0000 mg | ORAL_CAPSULE | Freq: Every day | ORAL | Status: DC

## 2013-12-01 MED ORDER — HYDROCODONE-ACETAMINOPHEN 5-325 MG PO TABS: 1.0000 | ORAL_TABLET | Freq: Every day | ORAL | Status: DC

## 2013-12-01 MED ORDER — BUPROPION HCL ER (XL) 150 MG PO TB24: 150.0000 mg | ORAL_TABLET | Freq: Every day | ORAL | Status: DC

## 2013-12-01 MED ORDER — AMPHETAMINE-DEXTROAMPHETAMINE 10 MG PO TABS: ORAL_TABLET | ORAL | Status: DC

## 2013-12-01 MED ORDER — TRAMADOL HCL 50 MG PO TABS: 50.0000 mg | ORAL_TABLET | Freq: Two times a day (BID) | ORAL | Status: DC | PRN

## 2013-12-01 MED ORDER — LISINOPRIL-HYDROCHLOROTHIAZIDE 20-12.5 MG PO TABS: 1.0000 | ORAL_TABLET | Freq: Every day | ORAL | Status: DC

## 2013-12-02 NOTE — Progress Notes (Signed)
   Subjective:    Patient ID: Donald PoreBrianna Rosene, female    DOB: [birth year], 38 y.o.   MRN: [id number]  HPI Pt presents to the clinic to get refills on ADHD medication. Pt now has a full time job. She feels like medication is not working as well as it should. She continues to have problems focusing more in the afternoon. She would like to try extended release.  Anxiety- controlled on wellbutrin.    Ongoing low back pain- on tramadol as needed during the day and norco at bedtime to control pain. Needs refill.    Review of Systems  All other systems reviewed and are negative.      Objective:   Physical Exam  Constitutional: She is oriented to person, place, and time. She appears well-developed and well-nourished.  HENT:  Head: Normocephalic and atraumatic.  Cardiovascular: Regular rhythm and normal heart sounds.   Tachycardic when rechecked had come down to 101.   Neurological: She is alert and oriented to person, place, and time.  Skin: Skin is dry.  Psychiatric: She has a normal mood and affect. Her behavior is normal.          Assessment & Plan:  HTN- BP is up but pt forgot to take medication for yesterday and today. Encouraged pt to take medication daily and especially when coming in for office visit. Refilled lisinopril.  Follow up in 3 months.   ADHD- will switch to Adderall XR 30mg  daily gave 10mg  immediate release to take at 1pm if needed. Follow up 3 months.   GAD- controlled on wellbutrin. Refilled for 3 months.   Low back pain, chronic- tramadol, norco and mobic refilled.

## 2013-12-16 ENCOUNTER — Telehealth: Payer: Self-pay | Admitting: *Deleted

## 2013-12-16 NOTE — Telephone Encounter (Signed)
I think she should make appt for next week with Jade. Is sound like the IR vesion wasn't working great and now the ER is not working great either. She was on 30mg  dose which is the highest.  They may need to switch to a different ADD med completely like Concerta, etc.

## 2013-12-16 NOTE — Telephone Encounter (Signed)
Pt calls today stating that she "hates" the ER adderall.  She states that she's been on it for about 3 weeks now & she feels very unfocused & says she feels "dumb"; like she's not taken any med at all.  She wants to know if she can go back to the reg adderall again.  Please advise.

## 2013-12-20 NOTE — Telephone Encounter (Signed)
She needs to come in and bring rx for other adderall prescriptions. We may need to try another stimulant.

## 2013-12-20 NOTE — Telephone Encounter (Signed)
I tried calling Alicia Newton several times Fri morning but there was no ringtone or any sound at all & then I left at lunch.  Do you agree with Metheney & want her to come back in? Please advise.

## 2013-12-20 NOTE — Telephone Encounter (Signed)
Pt.notified

## 2013-12-27 ENCOUNTER — Encounter: Payer: Self-pay | Admitting: Physician Assistant

## 2013-12-27 ENCOUNTER — Ambulatory Visit (INDEPENDENT_AMBULATORY_CARE_PROVIDER_SITE_OTHER): Payer: BC Managed Care – PPO | Admitting: Physician Assistant

## 2013-12-27 VITALS — BP 129/82 | HR 100 | Ht 61.0 in | Wt 142.0 lb

## 2013-12-27 DIAGNOSIS — R000 Tachycardia, unspecified: Secondary | ICD-10-CM

## 2013-12-27 DIAGNOSIS — F901 Attention-deficit hyperactivity disorder, predominantly hyperactive type: Secondary | ICD-10-CM

## 2013-12-27 DIAGNOSIS — M5430 Sciatica, unspecified side: Secondary | ICD-10-CM

## 2013-12-27 DIAGNOSIS — F909 Attention-deficit hyperactivity disorder, unspecified type: Secondary | ICD-10-CM

## 2013-12-27 DIAGNOSIS — M5442 Lumbago with sciatica, left side: Secondary | ICD-10-CM

## 2013-12-27 MED ORDER — AMPHETAMINE-DEXTROAMPHET ER 20 MG PO CP24: 20.0000 mg | ORAL_CAPSULE | ORAL | Status: DC

## 2013-12-27 MED ORDER — HYDROCODONE-ACETAMINOPHEN 5-325 MG PO TABS: 1.0000 | ORAL_TABLET | Freq: Every day | ORAL | Status: DC

## 2013-12-27 MED ORDER — AMPHETAMINE-DEXTROAMPHETAMINE 20 MG PO TABS: 20.0000 mg | ORAL_TABLET | Freq: Every day | ORAL | Status: DC

## 2013-12-27 MED ORDER — CYCLOBENZAPRINE HCL 10 MG PO TABS: 10.0000 mg | ORAL_TABLET | Freq: Three times a day (TID) | ORAL | Status: DC | PRN

## 2013-12-29 DIAGNOSIS — R000 Tachycardia, unspecified: Secondary | ICD-10-CM | POA: Insufficient documentation

## 2013-12-29 NOTE — Progress Notes (Signed)
   Subjective:    Patient ID: Alicia Newton, female    DOB: 03/29/1976, 38 y.o.   MRN: [id number]  HPI Pt presents to the clinic for medication refill.   ADHD- currently on adderall XR 30 and fasting acting Adderall 10mg  at 1pm. She just still feels like she looses focus after lunch. She wants to stay on XR but did like effects of IR. Denies any anorexia, insomnia or other side effects.   Need refill on norco for back pain. Pain controlled with norco.    Review of Systems  All other systems reviewed and are negative.      Objective:   Physical Exam  Constitutional: She is oriented to person, place, and time. She appears well-developed and well-nourished.  HENT:  Head: Normocephalic and atraumatic.  Cardiovascular: Regular rhythm and normal heart sounds.   Tachycardia at 100  Pulmonary/Chest: Effort normal and breath sounds normal. She has no wheezes.  Neurological: She is alert and oriented to person, place, and time.  Skin: Skin is dry.  Psychiatric: She has a normal mood and affect. Her behavior is normal.          Assessment & Plan:  ADHD- switched to Adderall XR 20mg  and Adderall 20mg  at 1pm. Discussed with pt that she is expecting a IR feeling with XR drug. The release is much more slow but throughout the whole day. Follow up in 1 month.  Discussed if HR is still elevated at next visit may need to consider BB to slow down pulse.   Back pain- refiled norco.

## 2014-01-07 ENCOUNTER — Telehealth: Payer: Self-pay | Admitting: Physician Assistant

## 2014-01-07 NOTE — Telephone Encounter (Signed)
Called pt due to [facility] clinic note that she was pregnant. i want to make sure she is off all medications that could be harmful to fetus.   Pt mailbox full. Will attempt again on Monday.

## 2014-01-10 NOTE — Telephone Encounter (Signed)
LMOM for pt to return call. 

## 2014-01-18 NOTE — Telephone Encounter (Signed)
Pt was notified by Lesly Rubenstein.

## 2014-01-25 ENCOUNTER — Telehealth: Payer: Self-pay | Admitting: *Deleted

## 2014-01-25 NOTE — Telephone Encounter (Signed)
Alicia Newton discussed at appt tomorrow

## 2014-01-25 NOTE — Telephone Encounter (Signed)
Pt left vm stating that she has been off of her adderall for the last 7-8 days and she can't hardly get out of bed or even function.  She wants to know if you would be willing to decrease it to  for about 2 weeks so she can function & wean down.  Please advise.

## 2014-01-26 ENCOUNTER — Encounter: Payer: Self-pay | Admitting: Physician Assistant

## 2014-01-26 ENCOUNTER — Ambulatory Visit (INDEPENDENT_AMBULATORY_CARE_PROVIDER_SITE_OTHER): Payer: BC Managed Care – PPO | Admitting: Physician Assistant

## 2014-01-26 ENCOUNTER — Ambulatory Visit: Payer: BC Managed Care – PPO | Admitting: Physician Assistant

## 2014-01-26 VITALS — BP 132/90 | HR 110 | Ht 61.0 in | Wt 149.0 lb

## 2014-01-26 DIAGNOSIS — F1190 Opioid use, unspecified, uncomplicated: Secondary | ICD-10-CM

## 2014-01-26 DIAGNOSIS — M545 Low back pain, unspecified: Secondary | ICD-10-CM

## 2014-01-26 DIAGNOSIS — F1110 Opioid abuse, uncomplicated: Secondary | ICD-10-CM

## 2014-01-26 DIAGNOSIS — F902 Attention-deficit hyperactivity disorder, combined type: Secondary | ICD-10-CM

## 2014-01-26 DIAGNOSIS — F909 Attention-deficit hyperactivity disorder, unspecified type: Secondary | ICD-10-CM | POA: Diagnosis not present

## 2014-01-26 MED ORDER — AMPHETAMINE-DEXTROAMPHET ER 5 MG PO CP24: 5.0000 mg | ORAL_CAPSULE | Freq: Every day | ORAL | Status: DC

## 2014-01-26 MED ORDER — HYDROCODONE-ACETAMINOPHEN 5-325 MG PO TABS: ORAL_TABLET | ORAL | Status: DC

## 2014-01-27 LAB — DRUG SCREEN, URINE
Amphetamine Screen, Ur: NEGATIVE
BARBITURATE QUANT UR: NEGATIVE
Benzodiazepines.: NEGATIVE
CREATININE, U: 63.54 mg/dL
Cocaine Metabolites: NEGATIVE
MARIJUANA METABOLITE: NEGATIVE
Methadone: NEGATIVE
OPIATES: POSITIVE — AB
PROPOXYPHENE: NEGATIVE
Phencyclidine (PCP): NEGATIVE

## 2014-01-31 NOTE — Progress Notes (Signed)
Subjective:    Patient ID: Alicia Newton, female    DOB: [birth year], 38 y.o.   MRN: [id number]  HPI Pt presents to the clinic to discuss stimulant and pain medication.   Pt is [redacted] weeks pregnant and struggling being off her norco and adderall. She has lost her job and trying to refocus to get back on track. She has been out of her adderall for 8 days and she is struggling somedays to get out of bed due to lack of motavation. Per pt her OB only requirment is that she gets off of it before baby is born so that it is not born in it's system. He is ok with occasional norco usage for back . He gave her tylenol 3 for daily use but when she went to the hospital for it she was given percocet. Her back pain is off and on to the position of the baby and what she has done that day.   Review of Systems  All other systems reviewed and are negative.      Objective:   Physical Exam  Constitutional: She is oriented to person, place, and time. She appears well-developed and well-nourished.  HENT:  Head: Normocephalic and atraumatic.  Cardiovascular: Regular rhythm and normal heart sounds.   Tachycardia at 110.   Pulmonary/Chest: Effort normal and breath sounds normal.  Neurological: She is alert and oriented to person, place, and time.  Skin: Skin is dry.  Psychiatric: She has a normal mood and affect. Her behavior is normal.          Assessment & Plan:  ADHD/pregnancy- .. Results for orders placed in visit on 01/26/14  DRUG SCREEN, URINE      Result Value Ref Range   Benzodiazepines. NEG  Negative   Phencyclidine (PCP) NEG  Negative   Cocaine Metabolites NEG  Negative   Amphetamine Screen, Ur NEG  Negative   Marijuana Metabolite NEG  Negative   Opiates POS (*) Negative   Barbiturate Quant, Ur NEG  Negative   Methadone NEG  Negative   Propoxyphene NEG  Negative   Creatinine,U 63.54     i wanted to confirm she is not using any other drugs. I discussed with adderall is out of her  symptom now and any experiencing any withdrawls. Adderall is NOT intended for pregnancy. Risk were discussed. Pt has for a lower dose and short quanity to feel like she has been "weaned" off. I explained to her you do not need to weaned off this medication. I also discussed I am not going to keep prescribing this medication through her pregnancy. I gave her  XR for 7 days. I asked her not to contact office until after delivery concerning stimulant. i explained to her she quite possiblity has other mental illness that need to be address. Discussed adding SSRI to medication list could help. Pt was not interested in SSRI. Offered referral to behavioral health she declined. Pt is assuming all risk since she has actually continued on a stimulant through half of pregnancy.   Important to note- she called yesterday to office with slurrled speech. Her OB had put her on blood pressure medication that lowered BP too low. She is not on any BP medication today. She follows up with OB tomorrow. We had considered other drugs causing this but do not believe that was the case.   Low back pain- gave small quantity of norco. Discussed only to use as needed for acute pain. Discussed massage therapy  warm compresses and daily stretches.

## 2014-02-25 ENCOUNTER — Other Ambulatory Visit: Payer: Self-pay | Admitting: *Deleted

## 2014-02-25 MED ORDER — HYDROCODONE-ACETAMINOPHEN 5-325 MG PO TABS: ORAL_TABLET | ORAL | Status: DC

## 2014-03-28 ENCOUNTER — Encounter: Payer: Self-pay | Admitting: Physician Assistant

## 2014-04-12 ENCOUNTER — Other Ambulatory Visit: Payer: Self-pay | Admitting: *Deleted

## 2014-04-12 MED ORDER — HYDROCODONE-ACETAMINOPHEN 5-325 MG PO TABS: ORAL_TABLET | ORAL | Status: DC

## 2014-04-19 ENCOUNTER — Ambulatory Visit (INDEPENDENT_AMBULATORY_CARE_PROVIDER_SITE_OTHER): Payer: Medicaid Other | Admitting: Physician Assistant

## 2014-04-19 ENCOUNTER — Encounter: Payer: Self-pay | Admitting: Physician Assistant

## 2014-04-19 VITALS — BP 161/94 | HR 87 | Ht 61.0 in | Wt 151.0 lb

## 2014-04-19 DIAGNOSIS — F901 Attention-deficit hyperactivity disorder, predominantly hyperactive type: Secondary | ICD-10-CM

## 2014-04-19 DIAGNOSIS — I10 Essential (primary) hypertension: Secondary | ICD-10-CM

## 2014-04-19 MED ORDER — LISINOPRIL-HYDROCHLOROTHIAZIDE 20-12.5 MG PO TABS: 1.0000 | ORAL_TABLET | Freq: Every day | ORAL | Status: DC

## 2014-04-19 NOTE — Progress Notes (Signed)
   Subjective:    Patient ID: Alicia PoreBrianna Newton, female    DOB: 07/10/1975, 38 y.o.   MRN: [id number]  HPI  Pt presents to the clinic to restart adderall after having a baby last week. She had son at 37 weeks with no complications but was induced due to elevated blood pressure. She is on 5mg  of lisinopril. Denies any CP, palpitations, SOB.      Review of Systems  All other systems reviewed and are negative.      Objective:   Physical Exam  Constitutional: She is oriented to person, place, and time. She appears well-developed and well-nourished.  HENT:  Head: Normocephalic and atraumatic.  Cardiovascular: Normal rate, regular rhythm and normal heart sounds.   Pulmonary/Chest: Effort normal and breath sounds normal.  Neurological: She is alert and oriented to person, place, and time.  Skin: Skin is dry.  Psychiatric: She has a normal mood and affect. Her behavior is normal.          Assessment & Plan:  ADHD- due to BP uncontrolled discussed will not given adderall. Need to get BP uncontrol then consider stimulant. She is not breastfeeding.    HTN- restarted lisinpril/HCTZ discussed NOT to get pregnant while on this drug. Will recheck in 1 week and determine if pt can start stimulant.

## 2014-04-20 ENCOUNTER — Ambulatory Visit: Payer: Medicaid Other

## 2014-04-23 IMAGING — CR DG LUMBAR SPINE COMPLETE 4+V
5 series · 5 of 5 positions shown · non-contrast
Comparison: None.

CLINICAL DATA: Low back pain.

EXAM:
LUMBAR SPINE - COMPLETE 4+ VIEW

[view not recorded (1 of 5)]
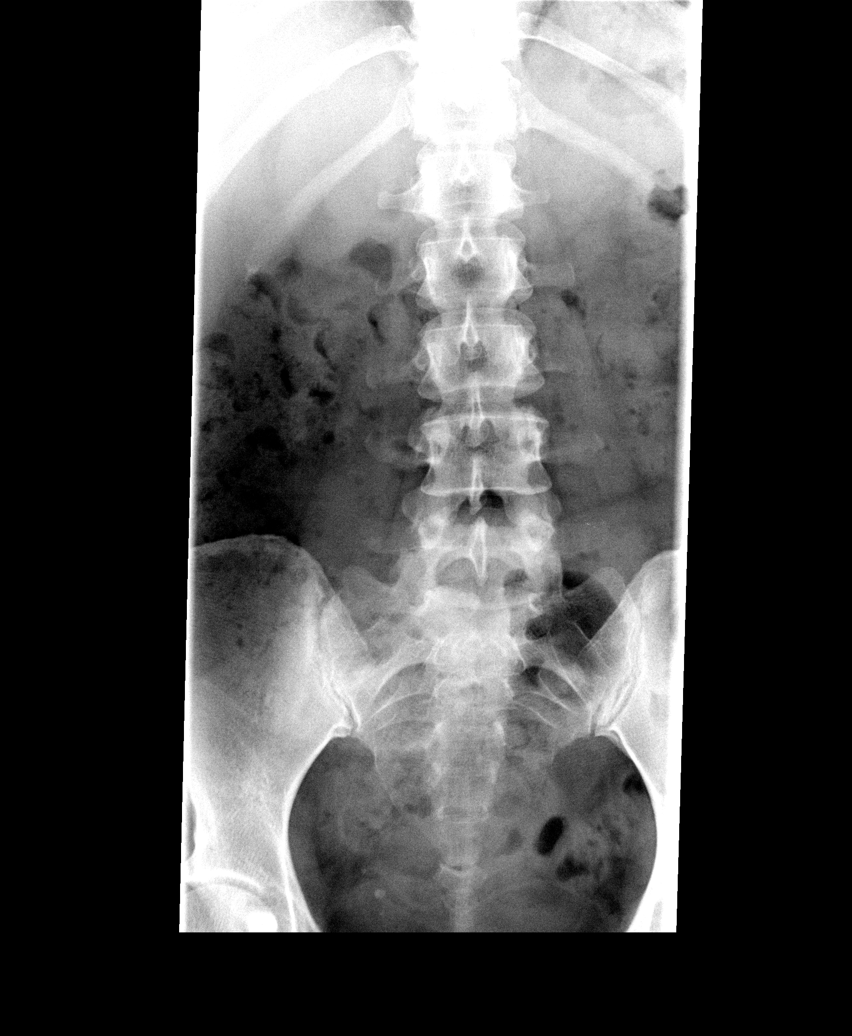

[view not recorded (2 of 5)]
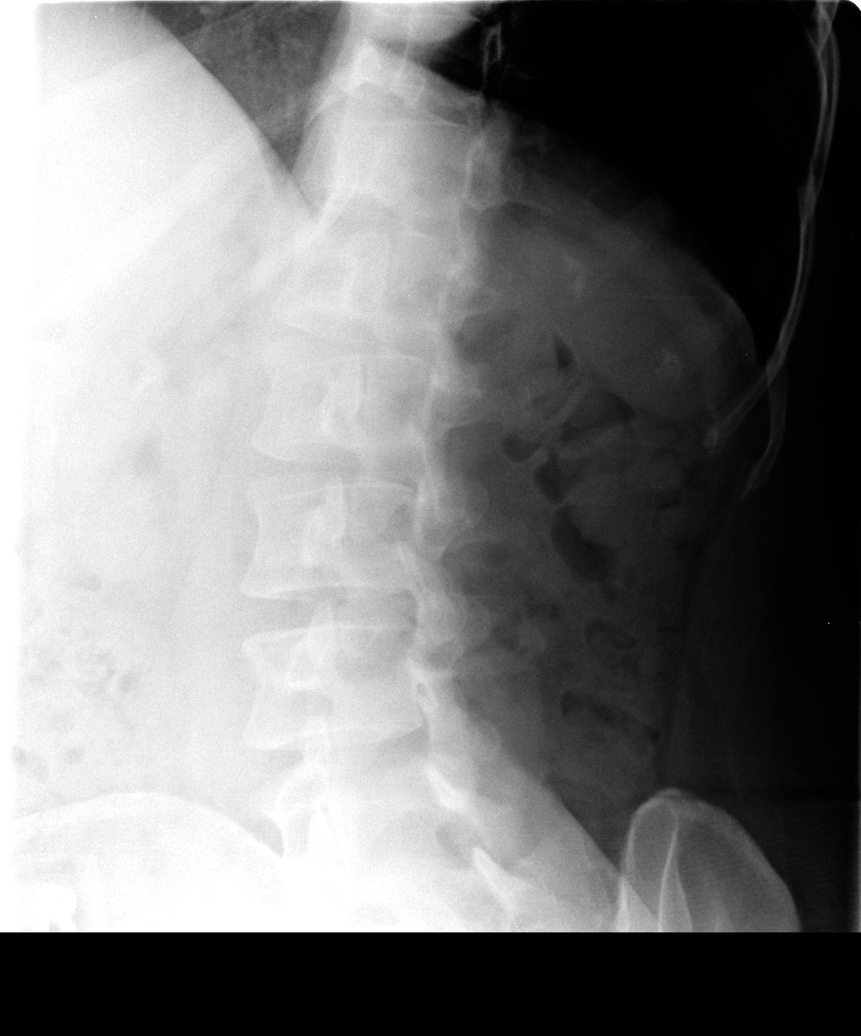

[view not recorded (3 of 5)]
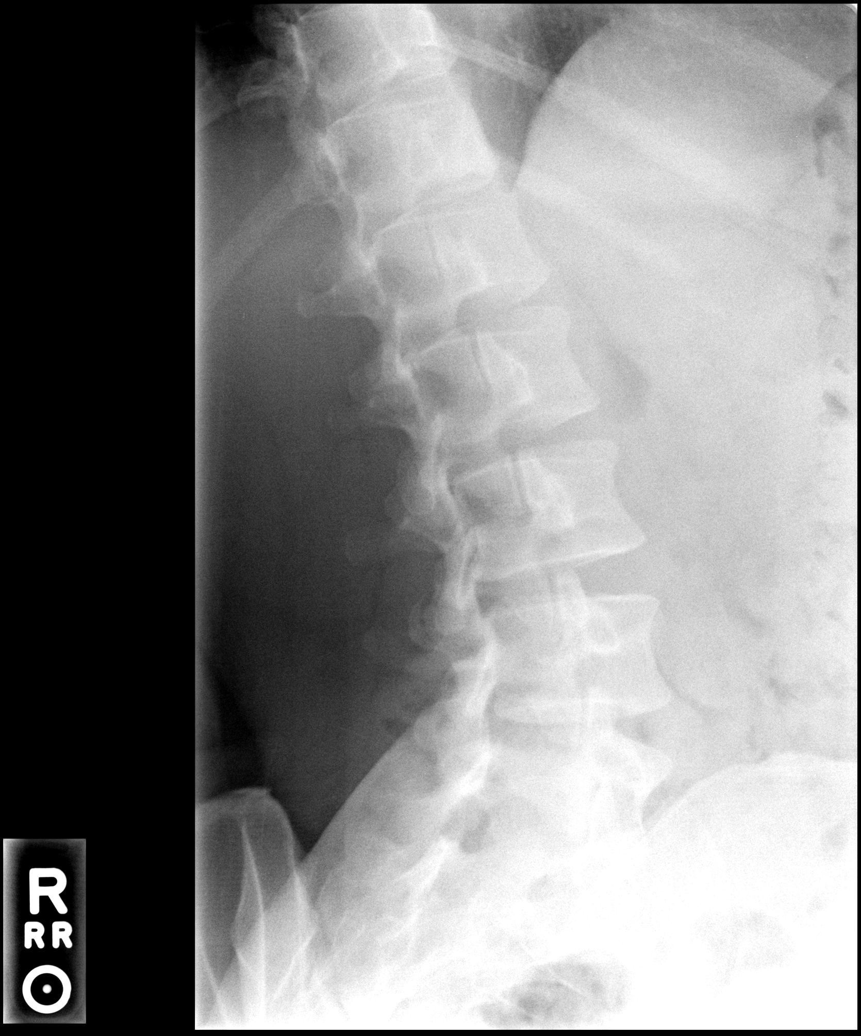

[view not recorded (4 of 5)]
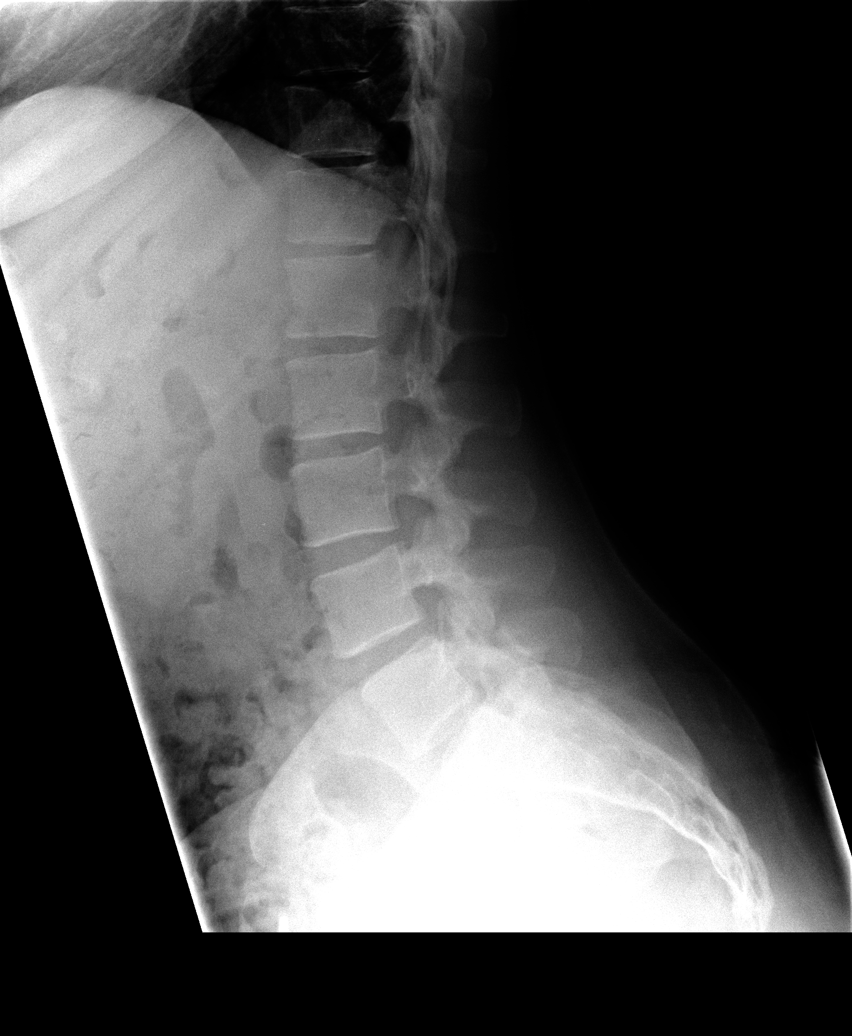

[view not recorded (5 of 5)]
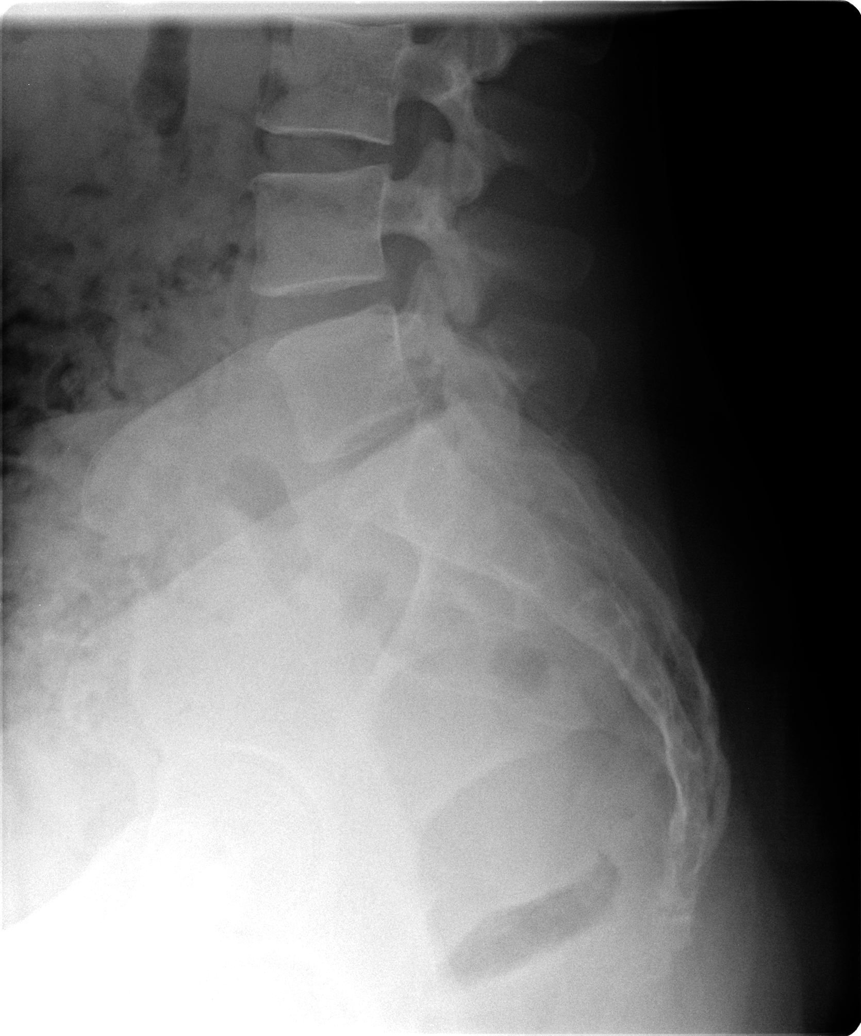

[5 of 5 positions shown; findings below may reference images not displayed]

FINDINGS: AP, lateral and oblique images of the lumbar spine were obtained.
Normal alignment of the lumbar spine. Mild disc space narrowing at
L5-S1. The vertebral body heights are maintained. No evidence for a
pars defect.
IMPRESSION: No acute bone abnormality.

There may be mild disc disease at L5-S1.

## 2014-04-25 ENCOUNTER — Ambulatory Visit (INDEPENDENT_AMBULATORY_CARE_PROVIDER_SITE_OTHER): Payer: Medicaid Other | Admitting: Physician Assistant

## 2014-04-25 VITALS — BP 125/88 | HR 101 | Wt 138.0 lb

## 2014-04-25 DIAGNOSIS — F901 Attention-deficit hyperactivity disorder, predominantly hyperactive type: Secondary | ICD-10-CM

## 2014-04-25 MED ORDER — AMPHETAMINE-DEXTROAMPHETAMINE 20 MG PO TABS: 20.0000 mg | ORAL_TABLET | Freq: Every day | ORAL | Status: DC

## 2014-04-25 MED ORDER — AMPHETAMINE-DEXTROAMPHET ER 5 MG PO CP24: 5.0000 mg | ORAL_CAPSULE | Freq: Every day | ORAL | Status: DC

## 2014-04-25 MED ORDER — AMPHETAMINE-DEXTROAMPHET ER 20 MG PO CP24
20.0000 mg | ORAL_CAPSULE | Freq: Every day | ORAL | Status: DC
Start: 2014-04-25 — End: 2014-04-25

## 2014-04-25 MED ORDER — AMPHETAMINE-DEXTROAMPHET ER 20 MG PO CP24
20.0000 mg | ORAL_CAPSULE | Freq: Every day | ORAL | Status: DC
Start: 2014-04-25 — End: 2014-05-25

## 2014-04-25 NOTE — Progress Notes (Signed)
   Subjective:    Patient ID: Alicia PoreBrianna Mcclain, female    DOB: 06/06/1975, 38 y.o.   MRN: [id number]  HPI  Colin MuldersBrianna reports for BP check in order to receive adderall refills.    Review of Systems     Objective:   Physical Exam        Assessment & Plan:  Pt restarted on Adderall after giving birth. Recheck in one month. Tandy GawJade Breeback PA-C

## 2014-05-13 ENCOUNTER — Other Ambulatory Visit: Payer: Self-pay | Admitting: *Deleted

## 2014-05-13 MED ORDER — HYDROCODONE-ACETAMINOPHEN 5-325 MG PO TABS: ORAL_TABLET | ORAL | Status: DC

## 2014-05-25 ENCOUNTER — Other Ambulatory Visit: Payer: Self-pay | Admitting: *Deleted

## 2014-05-25 MED ORDER — AMPHETAMINE-DEXTROAMPHET ER 20 MG PO CP24
20.0000 mg | ORAL_CAPSULE | Freq: Every day | ORAL | Status: DC
Start: 2014-05-25 — End: 2014-06-06

## 2014-05-25 MED ORDER — HYDROCODONE-ACETAMINOPHEN 5-325 MG PO TABS: ORAL_TABLET | ORAL | Status: DC

## 2014-05-25 MED ORDER — AMPHETAMINE-DEXTROAMPHETAMINE 20 MG PO TABS: 20.0000 mg | ORAL_TABLET | Freq: Every day | ORAL | Status: DC

## 2014-06-06 ENCOUNTER — Ambulatory Visit (INDEPENDENT_AMBULATORY_CARE_PROVIDER_SITE_OTHER): Payer: Medicaid Other | Admitting: Physician Assistant

## 2014-06-06 ENCOUNTER — Encounter: Payer: Self-pay | Admitting: Physician Assistant

## 2014-06-06 VITALS — BP 149/95 | HR 109 | Wt 129.0 lb

## 2014-06-06 DIAGNOSIS — I10 Essential (primary) hypertension: Secondary | ICD-10-CM

## 2014-06-06 DIAGNOSIS — I471 Supraventricular tachycardia: Secondary | ICD-10-CM

## 2014-06-06 DIAGNOSIS — F411 Generalized anxiety disorder: Secondary | ICD-10-CM

## 2014-06-06 DIAGNOSIS — F901 Attention-deficit hyperactivity disorder, predominantly hyperactive type: Secondary | ICD-10-CM

## 2014-06-06 DIAGNOSIS — R000 Tachycardia, unspecified: Secondary | ICD-10-CM

## 2014-06-06 MED ORDER — AMPHETAMINE-DEXTROAMPHETAMINE 30 MG PO TABS: 30.0000 mg | ORAL_TABLET | Freq: Two times a day (BID) | ORAL | Status: DC

## 2014-06-06 MED ORDER — ESCITALOPRAM OXALATE 10 MG PO TABS: 10.0000 mg | ORAL_TABLET | Freq: Every day | ORAL | Status: DC

## 2014-06-06 MED ORDER — METOPROLOL SUCCINATE ER 50 MG PO TB24: 50.0000 mg | ORAL_TABLET | Freq: Every day | ORAL | Status: DC

## 2014-06-06 NOTE — Progress Notes (Signed)
   Subjective:    Patient ID: Alicia PoreBrianna Newton, female    DOB: 06/11/1975, 39 y.o.   MRN: [id number]  HPI Pt presents to the clinic for ADHD. She is 12 week post-partum and started looking for a job. She would like to go on immediate release that she was on when she first came to clinic. She is struggling to stay focussed and get resume and emails read. She admits to up and down mood with the baby. She is having to wake up at least once or twice for feedings during the night. She denies any cp, palpitations, headaches or vision changes.    Review of Systems  All other systems reviewed and are negative.      Objective:   Physical Exam  Constitutional: She is oriented to person, place, and time. She appears well-developed and well-nourished.  HENT:  Head: Normocephalic and atraumatic.  Cardiovascular: Regular rhythm and normal heart sounds.   Tachycardia at 109.   Pulmonary/Chest: Effort normal and breath sounds normal.  Neurological: She is alert and oriented to person, place, and time.  Skin: Skin is dry.  Psychiatric: She has a normal mood and affect. Her behavior is normal.          Assessment & Plan:  ADHD- will give one month of adderall 30mg  bid. She is borderline pulse and BP. If not coming down may have to suspend stimulant. Follow up in 1 month.   Tachycardia/HTN- added metoprolol 50mg  once daily with zestorectic daily. Follow up in one month.   GAD/post-partum stress- will add lexapro at bedtime. Follow up in one month. Pt not BF. Discussed side effects. Discussed can take 4-6 weeks to get to effective dose.

## 2014-06-10 ENCOUNTER — Telehealth: Payer: Self-pay | Admitting: *Deleted

## 2014-06-10 ENCOUNTER — Other Ambulatory Visit: Payer: Self-pay

## 2014-06-10 MED ORDER — METOPROLOL SUCCINATE ER 50 MG PO TB24: 50.0000 mg | ORAL_TABLET | Freq: Every day | ORAL | Status: DC

## 2014-06-10 MED ORDER — ESCITALOPRAM OXALATE 10 MG PO TABS
10.0000 mg | ORAL_TABLET | Freq: Every day | ORAL | Status: DC
Start: 2014-06-10 — End: 2014-09-09

## 2014-06-10 NOTE — Telephone Encounter (Signed)
Pharm called this morning because the pt was in trying to get her hydrocodone filled.  You signed it on 12/30 with a do not fill date of 1/18.  Pharm wanted to know if it was ok to fill early.  Please advise.

## 2014-06-10 NOTE — Telephone Encounter (Signed)
Pharm was already notified by Marylene LandAngela.

## 2014-06-10 NOTE — Telephone Encounter (Signed)
Ok

## 2014-06-27 ENCOUNTER — Ambulatory Visit: Payer: Medicaid Other | Admitting: Physician Assistant

## 2014-06-29 ENCOUNTER — Ambulatory Visit: Payer: Medicaid Other | Admitting: Physician Assistant

## 2014-06-29 ENCOUNTER — Encounter: Payer: Self-pay | Admitting: Physician Assistant

## 2014-06-29 ENCOUNTER — Ambulatory Visit (INDEPENDENT_AMBULATORY_CARE_PROVIDER_SITE_OTHER): Payer: Medicaid Other | Admitting: Physician Assistant

## 2014-06-29 VITALS — BP 109/78 | HR 98 | Ht 61.0 in | Wt 127.0 lb

## 2014-06-29 DIAGNOSIS — F430 Acute stress reaction: Secondary | ICD-10-CM

## 2014-06-29 DIAGNOSIS — F901 Attention-deficit hyperactivity disorder, predominantly hyperactive type: Secondary | ICD-10-CM | POA: Diagnosis not present

## 2014-06-29 DIAGNOSIS — Z3042 Encounter for surveillance of injectable contraceptive: Secondary | ICD-10-CM

## 2014-06-29 DIAGNOSIS — M545 Low back pain: Secondary | ICD-10-CM

## 2014-06-29 DIAGNOSIS — F411 Generalized anxiety disorder: Secondary | ICD-10-CM

## 2014-06-29 DIAGNOSIS — I10 Essential (primary) hypertension: Secondary | ICD-10-CM

## 2014-06-29 DIAGNOSIS — R000 Tachycardia, unspecified: Secondary | ICD-10-CM

## 2014-06-29 MED ORDER — HYDROCODONE-ACETAMINOPHEN 5-325 MG PO TABS: ORAL_TABLET | ORAL | Status: DC

## 2014-06-29 MED ORDER — AMPHETAMINE-DEXTROAMPHETAMINE 30 MG PO TABS: 30.0000 mg | ORAL_TABLET | Freq: Two times a day (BID) | ORAL | Status: DC

## 2014-06-29 MED ORDER — MEDROXYPROGESTERONE ACETATE 150 MG/ML IM SUSP
150.0000 mg | Freq: Once | INTRAMUSCULAR | Status: AC
  Administered 2014-06-29: 150 mg via INTRAMUSCULAR

## 2014-06-29 MED ORDER — METOPROLOL SUCCINATE ER 50 MG PO TB24: 50.0000 mg | ORAL_TABLET | Freq: Every day | ORAL | Status: DC

## 2014-06-29 NOTE — Progress Notes (Signed)
   Subjective:    Patient ID: Alicia PoreBrianna Newton, female    DOB: January 21, 1976, 39 y.o.   MRN: [id number]  HPI  Patient is a 39 year old female who presents to the clinic to follow-up on ADHD. She is doing well on current dose. She would like refill.  She continues to have a lot of anxiety. Her daughter recently asked to go on birth control. She is becoming much more independent and this is causing some anxiousness. Patient also has a new baby at home. She is being forced to get a job due to financial situations. Her son is also not doing well in school and she is concerned about his grades. She does have a lot going on. She denies any suicidal or homicidal thoughts.  She does need a refill on her hydrocodone for use at bedtime for low back pain.  She did get depo shot in the hospital at the end of November. She wonders when she is due for her next depo shot.   Review of Systems  All other systems reviewed and are negative.      Objective:   Physical Exam  Constitutional: She is oriented to person, place, and time. She appears well-developed and well-nourished.  HENT:  Head: Normocephalic and atraumatic.  Cardiovascular: Normal rate, regular rhythm and normal heart sounds.   Pulmonary/Chest: Effort normal and breath sounds normal.  Neurological: She is alert and oriented to person, place, and time.  Skin: Skin is dry.  Psychiatric: She has a normal mood and affect. Her behavior is normal.          Assessment & Plan:  ADHD- refilled adderall for 3 months.   GAD/Acute stress reaction- discussed need to start lexapro. Pt needs SSRI for anxiety. Discussed side effects vs benefits. Pt agrees to start.   Low back pain- refilled vicodin for one tablet at bedtime.   Contraception management- depo shot given today with follow up in 3 months.   Tachycardia- reminded pt to start atenolol.   HTN- doing well. Continue on lisinopril/HCTZ.

## 2014-07-25 ENCOUNTER — Other Ambulatory Visit: Payer: Self-pay | Admitting: *Deleted

## 2014-07-25 MED ORDER — AMPHETAMINE-DEXTROAMPHETAMINE 30 MG PO TABS: 30.0000 mg | ORAL_TABLET | Freq: Two times a day (BID) | ORAL | Status: DC

## 2014-08-12 ENCOUNTER — Telehealth: Payer: Self-pay | Admitting: Family Medicine

## 2014-08-12 ENCOUNTER — Other Ambulatory Visit: Payer: Self-pay

## 2014-08-12 MED ORDER — HYDROCODONE-ACETAMINOPHEN 5-325 MG PO TABS: ORAL_TABLET | ORAL | Status: DC

## 2014-08-12 NOTE — Telephone Encounter (Signed)
Pharmacist called to clarify sig on the hydrocodone script.  Should read one at bedtime PRN pain.  OK to fill with new sig. Will need to be corrected for next prescription fill.   Nani Gasseratherine Metheney, MD

## 2014-08-15 NOTE — Telephone Encounter (Signed)
Called pharm to give them clarification and they already received it over the weekend.

## 2014-09-05 NOTE — Telephone Encounter (Signed)
close

## 2014-09-07 ENCOUNTER — Ambulatory Visit: Payer: Medicaid Other | Admitting: Physician Assistant

## 2014-09-09 ENCOUNTER — Encounter: Payer: Self-pay | Admitting: Physician Assistant

## 2014-09-09 ENCOUNTER — Ambulatory Visit (INDEPENDENT_AMBULATORY_CARE_PROVIDER_SITE_OTHER): Payer: Medicaid Other | Admitting: Physician Assistant

## 2014-09-09 VITALS — BP 152/88 | HR 117 | Wt 130.0 lb

## 2014-09-09 DIAGNOSIS — I10 Essential (primary) hypertension: Secondary | ICD-10-CM

## 2014-09-09 DIAGNOSIS — F901 Attention-deficit hyperactivity disorder, predominantly hyperactive type: Secondary | ICD-10-CM

## 2014-09-09 DIAGNOSIS — N938 Other specified abnormal uterine and vaginal bleeding: Secondary | ICD-10-CM | POA: Diagnosis not present

## 2014-09-09 DIAGNOSIS — R000 Tachycardia, unspecified: Secondary | ICD-10-CM | POA: Diagnosis not present

## 2014-09-09 MED ORDER — ESCITALOPRAM OXALATE 10 MG PO TABS: 10.0000 mg | ORAL_TABLET | Freq: Every day | ORAL | Status: AC

## 2014-09-09 MED ORDER — LISINOPRIL-HYDROCHLOROTHIAZIDE 20-12.5 MG PO TABS: 1.0000 | ORAL_TABLET | Freq: Every day | ORAL | Status: AC

## 2014-09-09 MED ORDER — HYDROCODONE-ACETAMINOPHEN 5-325 MG PO TABS: ORAL_TABLET | ORAL | Status: DC

## 2014-09-09 MED ORDER — METOPROLOL SUCCINATE ER 50 MG PO TB24: 50.0000 mg | ORAL_TABLET | Freq: Every day | ORAL | Status: AC

## 2014-09-09 MED ORDER — LISDEXAMFETAMINE DIMESYLATE 30 MG PO CAPS: 30.0000 mg | ORAL_CAPSULE | Freq: Every day | ORAL | Status: DC

## 2014-09-09 NOTE — Patient Instructions (Addendum)
Will get pelvic/transvaginal ultrasound.

## 2014-09-09 NOTE — Progress Notes (Signed)
   Subjective:    Patient ID: Alicia Newton, female    DOB: 1975/11/04, 39 y.o.   MRN: [id number]  HPI  Pt presents to the clinic feeling overwhelmed, anxious, unable to focus, no motivation. She would like to try something other than adderall. Adderall XR did not work.   She denies any CP, palpitations, headaches. She is not taking metoprolol. Per pt states once again she forgot to pick up. She does feel like her heart races at times. She is taking lisinopril.   She is also having heavy ongoing bleeding. On depo shot and due later this month. Not currently bleeding. Some mild cramping to note.     Review of Systems  All other systems reviewed and are negative.      Objective:   Physical Exam  Constitutional: She is oriented to person, place, and time. She appears well-developed and well-nourished.  HENT:  Head: Normocephalic and atraumatic.  Cardiovascular: Regular rhythm and normal heart sounds.   Tachycardia at 117.   Pulmonary/Chest: Effort normal and breath sounds normal.  Neurological: She is alert and oriented to person, place, and time.  Psychiatric:  Very anxious.           Assessment & Plan:  HTN/tachycardia- Discussed with patient to come in one month. If not taking both HTN medications and BP and pulse normal range will not continue to prescribe stimulant. If does not make appt then also will NOT give any more stimulants.    ADHD- Stop adderall. Start vyvanse extended release only needed once daily. Discussed if not tolerating may need to consider ADHD specialist. Pt most come in 1 month to discuss stimulant and HTN and BP.   GAD- I also think anxiety is playing a role. START lexapro to help with mood control.   DUB- on depo due for next injection later this month. She is having some bleeding discussed that is irratic. Certainly mood, anxiety, stress can effect periods. She is not currently bleeding. Will make sure thyroid is good, will order CBC. Will get  pelivc and transvaginal ultrasound.

## 2014-09-10 LAB — CBC WITH DIFFERENTIAL/PLATELET
Basophils Absolute: 0 10*3/uL (ref 0.0–0.1)
Basophils Relative: 0 % (ref 0–1)
Eosinophils Absolute: 0 10*3/uL (ref 0.0–0.7)
Eosinophils Relative: 0 % (ref 0–5)
HEMATOCRIT: 44 % (ref 36.0–46.0)
Hemoglobin: 14.5 g/dL (ref 12.0–15.0)
LYMPHS PCT: 14 % (ref 12–46)
Lymphs Abs: 2.2 10*3/uL (ref 0.7–4.0)
MCH: 27.8 pg (ref 26.0–34.0)
MCHC: 33 g/dL (ref 30.0–36.0)
MCV: 84.3 fL (ref 78.0–100.0)
MPV: 9.4 fL (ref 8.6–12.4)
Monocytes Absolute: 0.8 10*3/uL (ref 0.1–1.0)
Monocytes Relative: 5 % (ref 3–12)
NEUTROS ABS: 12.8 10*3/uL — AB (ref 1.7–7.7)
Neutrophils Relative %: 81 % — ABNORMAL HIGH (ref 43–77)
Platelets: 599 10*3/uL — ABNORMAL HIGH (ref 150–400)
RBC: 5.22 MIL/uL — AB (ref 3.87–5.11)
RDW: 13.9 % (ref 11.5–15.5)
WBC: 15.8 10*3/uL — AB (ref 4.0–10.5)

## 2014-09-10 LAB — TSH: TSH: 0.931 u[IU]/mL (ref 0.350–4.500)

## 2014-09-16 ENCOUNTER — Ambulatory Visit: Payer: Medicaid Other

## 2014-10-13 ENCOUNTER — Other Ambulatory Visit: Payer: Self-pay | Admitting: *Deleted

## 2014-10-13 ENCOUNTER — Other Ambulatory Visit: Payer: Self-pay

## 2014-10-13 MED ORDER — LISDEXAMFETAMINE DIMESYLATE 30 MG PO CAPS: 30.0000 mg | ORAL_CAPSULE | Freq: Every day | ORAL | Status: AC

## 2014-10-13 MED ORDER — HYDROCODONE-ACETAMINOPHEN 5-325 MG PO TABS: ORAL_TABLET | ORAL | Status: DC

## 2014-10-20 ENCOUNTER — Telehealth: Payer: Self-pay | Admitting: Family Medicine

## 2014-10-20 DIAGNOSIS — F902 Attention-deficit hyperactivity disorder, combined type: Secondary | ICD-10-CM

## 2014-10-20 NOTE — Telephone Encounter (Signed)
Patient called to state the Rx for VYVANSE is not helping her "at all" and is requesting to go back on her Adderall. Patient states she was taking 2 regular 30mg  adderall daily. Please advise.

## 2014-10-21 NOTE — Telephone Encounter (Signed)
OK will refer to ADHD specialist as per notes form last OV

## 2014-10-21 NOTE — Telephone Encounter (Signed)
Patient advised.

## 2014-11-10 ENCOUNTER — Telehealth: Payer: Self-pay | Admitting: *Deleted

## 2014-11-10 NOTE — Telephone Encounter (Signed)
Pt has left one vm today and has also called about 10 other times.  She is wanting a refill on her vyvanse & hydrocodone.  I spoke with Selena Batten from behavioral health and the pt is scheduled with Dr. Gilmore Laroche on July 19 at 11:00. While I was typing this note, she also called Tonya's line stating that she is going out of town for father's day.

## 2014-11-11 ENCOUNTER — Encounter: Payer: Self-pay | Admitting: Family Medicine

## 2014-11-11 MED ORDER — HYDROCODONE-ACETAMINOPHEN 5-325 MG PO TABS: ORAL_TABLET | ORAL | Status: AC

## 2014-11-11 NOTE — Telephone Encounter (Signed)
I did refill her hydrocodone today so she can pick it up today. We are not going to refill any stimulant ADD or ADHD medications that are scheduled drugs.

## 2014-11-11 NOTE — Telephone Encounter (Signed)
I tried to call pt back this morning on the phone number she left for me on her vm.  It ended up being her mother-in-law's phone number.  She advised me that the pt wasn't there so I asked her to let the pt know that her hydrocodone was ready for pick up but we can no longer rx ADHD meds for her.  Mother-in-law then goes on to tell me how differently the pt acts when she's taking the ADHD meds.  She stated that she's angrier, she snaps and yells at family members, and that when she's taking it that she will be up for 3 days straight then crash.  She stated that the pt's husband and son have been begging her to stop taking those medications, that she's better off without them.  I asked mother-in-law if she thought the pt had a problem & if she takes more than is prescribed; she answered with a definite YES to both questions.  On her own accord, she offered up the information that when the pt is out of her own medications she will then take her mom's (adderall and hydrocodone). We no longer rx mom's hydrocodone due to a neg UDS.  I advised mother-in-law that I am required to document these things and she said "you do what you need to but you didn't hear it from me."  I investigated further by going into the controlled substance database.  I found that since Dec she has seen and been prescribed controlled substances by 7 different providers, including oxycodone, hydrocodone, adderall, & tramadol.  Spoke with Amy from [facility] which was the last prescriber and they discharged her on 5/26 for the same thing.  Discharge letter from our office has been printed and signed today by Dr. Linford Arnold.

## 2014-12-13 ENCOUNTER — Ambulatory Visit (HOSPITAL_COMMUNITY): Payer: Medicaid Other | Admitting: Psychiatry
